# Patient Record
Sex: Male | Born: 1975 | Race: White | Hispanic: No | Marital: Married | Smoking: Never smoker
Health system: Southern US, Community
[De-identification: ages and names within clinical notes are randomized; demographics above are authoritative.]

## PROBLEM LIST (undated history)

## (undated) DIAGNOSIS — K589 Irritable bowel syndrome without diarrhea: Secondary | ICD-10-CM

## (undated) DIAGNOSIS — N200 Calculus of kidney: Secondary | ICD-10-CM

## (undated) HISTORY — DX: Calculus of kidney: N20.0

## (undated) HISTORY — PX: WISDOM TOOTH EXTRACTION: SHX21

## (undated) HISTORY — DX: Irritable bowel syndrome, unspecified: K58.9

---

## 2000-11-27 ENCOUNTER — Encounter: Payer: Self-pay | Admitting: Internal Medicine

## 2004-10-03 ENCOUNTER — Ambulatory Visit: Payer: Self-pay | Admitting: Urology

## 2008-03-25 DIAGNOSIS — K589 Irritable bowel syndrome without diarrhea: Secondary | ICD-10-CM

## 2008-05-26 ENCOUNTER — Ambulatory Visit: Payer: Self-pay | Admitting: Internal Medicine

## 2011-04-09 ENCOUNTER — Encounter: Payer: Self-pay | Admitting: Internal Medicine

## 2011-04-10 ENCOUNTER — Encounter: Payer: Self-pay | Admitting: Internal Medicine

## 2011-04-10 ENCOUNTER — Ambulatory Visit (INDEPENDENT_AMBULATORY_CARE_PROVIDER_SITE_OTHER): Payer: Self-pay | Admitting: Internal Medicine

## 2011-04-10 DIAGNOSIS — R519 Headache, unspecified: Secondary | ICD-10-CM | POA: Insufficient documentation

## 2011-04-10 DIAGNOSIS — R51 Headache: Secondary | ICD-10-CM

## 2011-04-10 NOTE — Assessment & Plan Note (Signed)
No neuro findings Clearly seems to be caffeine withdrawal Discussed gradual decrease in amount of caffeine he drinks but keep consistent day to day Discussed decreasing sugared sodas as well

## 2011-04-10 NOTE — Progress Notes (Signed)
  Subjective:    Patient ID: Blake Bruce, male    DOB: Oct 11, 1975, 35 y.o.   MRN: [id number]  HPI Does get occ right temproal headaches Now has been having every Sunday Has been constant over the past few days  All day 2 days ago, then yesterday afternoon Usually has started by lunch on Sunday and then continues---probably last 6-8 weeks Sunday is his day out of routine--doesn't have to get up early (will sleep in to 8AM) Has noted the headache start upon standing up quickly at times  Sharp pain, then can feel it "like with my pulse"--pounding No help with local pressure No nausea No photo or sonophobia No aura  Regular caffeine drinker---Dr Pepper. Doesn't have this on Sunday 24 ounces in AM, 16 ounces at lunch and sometimes some in afternoon Will switch to decaf for evening  No current outpatient prescriptions on file prior to visit.    Allergies  Allergen Reactions  . Sulfonamide Derivatives     REACTION: UNSPECIFIED    Past Medical History  Diagnosis Date  . IBS (irritable bowel syndrome)   . History of barium enema     normal 01/22/01    No past surgical history on file.  Family History  Problem Relation Age of Onset  . Cancer Mother     ovarian cancer  . Healthy Father   . Cancer Maternal Grandmother     breast cancer  . Cancer Maternal Grandfather     prostate cancer  . Heart disease Neg Hx   . Hypertension Neg Hx   . Diabetes Neg Hx     History   Social History  . Marital Status: Married    Spouse Name: N/A    Number of Children: N/A  . Years of Education: N/A   Occupational History  . landscaper    Social History Main Topics  . Smoking status: Never Smoker   . Smokeless tobacco: Never Used  . Alcohol Use: Yes  . Drug Use: No  . Sexually Active: Not on file   Other Topics Concern  . Not on file   Social History Narrative  . No narrative on file   Review of Systems No swallowing or speech problems No weakness    Objective:   Physical Exam  Constitutional: He appears well-developed and well-nourished. No distress.  Eyes: Conjunctivae and EOM are normal. Pupils are equal, round, and reactive to light.       Fundi benign  Neck: Normal range of motion. Neck supple.  Lymphadenopathy:    He has no cervical adenopathy.  Neurological: He is alert. He has normal strength. He displays no atrophy and no tremor. No cranial nerve deficit or sensory deficit. He exhibits normal muscle tone. He displays a negative Romberg sign. Coordination and gait normal.          Assessment & Plan:

## 2013-11-15 DIAGNOSIS — N200 Calculus of kidney: Secondary | ICD-10-CM

## 2013-11-15 HISTORY — DX: Calculus of kidney: N20.0

## 2013-11-29 ENCOUNTER — Ambulatory Visit (INDEPENDENT_AMBULATORY_CARE_PROVIDER_SITE_OTHER): Payer: 59 | Admitting: Family Medicine

## 2013-11-29 ENCOUNTER — Encounter: Payer: Self-pay | Admitting: Family Medicine

## 2013-11-29 DIAGNOSIS — N200 Calculus of kidney: Secondary | ICD-10-CM

## 2013-11-29 LAB — POCT URINALYSIS DIPSTICK
Bilirubin, UA: NEGATIVE
GLUCOSE UA: NEGATIVE
KETONES UA: NEGATIVE
Leukocytes, UA: NEGATIVE
Nitrite, UA: NEGATIVE
Protein, UA: 300
SPEC GRAV UA: 1.025
Urobilinogen, UA: 0.2
pH, UA: 6.5

## 2013-11-29 NOTE — Progress Notes (Signed)
The computer system has been down today. Please see her scanned note in the [facility] scanned section. 

## 2014-04-18 ENCOUNTER — Telehealth: Payer: Self-pay | Admitting: Internal Medicine

## 2014-04-18 NOTE — Telephone Encounter (Signed)
Left detailed message on VM with results, advised pt to call to confirm that he received this message

## 2014-04-18 NOTE — Telephone Encounter (Signed)
I would recommend he go to the ER for rabies prevention treatment. This is not available from our office. He should call ahead of time as they may have an alternative outpatient way for him to get the treatment

## 2014-04-18 NOTE — Telephone Encounter (Signed)
Patient Information:  Caller Name: Susann GivensFranklin  Phone: [phone]  Patient: Blake Bruce, Blake Bruce  Gender: Male  DOB: 08/09/1975  Age: 38 Years  PCP: Tillman AbideLetvak , [facility])  Office Follow Up:  Does the office need to follow up with this patient?: Yes  Instructions For The Office: Office please review with MD and make sure no further action is needed. Please call pt back with further instructions.  RN Note:  Office please review with MD and make sure no further action is needed. Please call pt back with further instructions.  Symptoms  Reason For Call & Symptoms: Pt is calling and states that he came in contact with a skunk on 04/14/14 and skunk was found to be positive for rabies;  skunk was caught in a fence and was dead; pt states that skunk was dead for approx 2 days prior to pt touching the skunk;   pt touched the skunk's 2 back legs and one front leg; this is the only contact pt had with the skunk; to the pt's knowledge he did not have any open wounds on his hands;  no symptoms at present time; animal control instructed for pt to f/u with the office  Reviewed Health History In EMR: Yes  Reviewed Medications In EMR: Yes  Reviewed Allergies In EMR: Yes  Reviewed Surgeries / Procedures: Yes  Date of Onset of Symptoms: 04/14/2014  Guideline(s) Used:  No Protocol Available - Information Only  Disposition Per Guideline:   Discuss with PCP and Callback by Nurse Today  Reason For Disposition Reached:   Nursing judgment  Advice Given:  Call Back If:  New symptoms develop  Patient Will Follow Care Advice:  YES

## 2014-04-19 NOTE — Telephone Encounter (Signed)
Spoke with patient and advised results, pt will call the ED

## 2014-04-20 ENCOUNTER — Emergency Department: Payer: Self-pay | Admitting: Emergency Medicine

## 2014-04-23 ENCOUNTER — Emergency Department: Payer: Self-pay | Admitting: Emergency Medicine

## 2014-04-27 ENCOUNTER — Emergency Department: Payer: Self-pay | Admitting: Emergency Medicine

## 2014-05-04 ENCOUNTER — Emergency Department: Payer: Self-pay | Admitting: Student

## 2014-05-17 HISTORY — PX: TONSILLECTOMY: SUR1361

## 2014-05-18 ENCOUNTER — Ambulatory Visit: Payer: Self-pay | Admitting: Otolaryngology

## 2014-09-02 ENCOUNTER — Encounter: Payer: Self-pay | Admitting: Internal Medicine

## 2014-09-02 ENCOUNTER — Ambulatory Visit (INDEPENDENT_AMBULATORY_CARE_PROVIDER_SITE_OTHER): Payer: 59 | Admitting: Internal Medicine

## 2014-09-02 VITALS — BP 110/80 | HR 65 | Temp 98.4°F | Ht 71.0 in | Wt 186.0 lb

## 2014-09-02 DIAGNOSIS — R079 Chest pain, unspecified: Secondary | ICD-10-CM

## 2014-09-02 DIAGNOSIS — Z0000 Encounter for general adult medical examination without abnormal findings: Secondary | ICD-10-CM | POA: Insufficient documentation

## 2014-09-02 LAB — COMPREHENSIVE METABOLIC PANEL
ALK PHOS: 53 U/L (ref 39–117)
ALT: 27 U/L (ref 0–53)
AST: 24 U/L (ref 0–37)
Albumin: 4.2 g/dL (ref 3.5–5.2)
BILIRUBIN TOTAL: 0.6 mg/dL (ref 0.2–1.2)
BUN: 20 mg/dL (ref 6–23)
CO2: 30 mEq/L (ref 19–32)
Calcium: 9.1 mg/dL (ref 8.4–10.5)
Chloride: 105 mEq/L (ref 96–112)
Creatinine, Ser: 1.12 mg/dL (ref 0.40–1.50)
GFR: 78.43 mL/min (ref >60.00)
GLUCOSE: 92 mg/dL (ref 70–99)
Potassium: 4.2 mEq/L (ref 3.5–5.1)
Sodium: 138 mEq/L (ref 135–145)
Total Protein: 7 g/dL (ref 6.0–8.3)

## 2014-09-02 LAB — LIPID PANEL
CHOLESTEROL: 168 mg/dL (ref 0–200)
HDL: 34.4 mg/dL — AB (ref >39.00)
LDL CALC: 117 mg/dL — AB (ref 0–99)
NonHDL: 133.6
Total CHOL/HDL Ratio: 5
Triglycerides: 82 mg/dL (ref 0.0–149.0)
VLDL: 16.4 mg/dL (ref 0.0–40.0)

## 2014-09-02 LAB — CBC WITH DIFFERENTIAL/PLATELET
BASOS ABS: 0 10*3/uL (ref 0.0–0.1)
BASOS PCT: 0.5 % (ref 0.0–3.0)
Eosinophils Absolute: 0.2 10*3/uL (ref 0.0–0.7)
Eosinophils Relative: 2.7 % (ref 0.0–5.0)
HEMATOCRIT: 42.9 % (ref 39.0–52.0)
HEMOGLOBIN: 14.5 g/dL (ref 13.0–17.0)
LYMPHS ABS: 1.3 10*3/uL (ref 0.7–4.0)
LYMPHS PCT: 20.6 % (ref 12.0–46.0)
MCHC: 33.9 g/dL (ref 30.0–36.0)
MCV: 84.6 fl (ref 78.0–100.0)
MONOS PCT: 7.7 % (ref 3.0–12.0)
Monocytes Absolute: 0.5 10*3/uL (ref 0.1–1.0)
NEUTROS ABS: 4.5 10*3/uL (ref 1.4–7.7)
Neutrophils Relative %: 68.5 % (ref 43.0–77.0)
Platelets: 225 10*3/uL (ref 150.0–400.0)
RBC: 5.07 Mil/uL (ref 4.22–5.81)
RDW: 13.8 % (ref 11.5–15.5)
WBC: 6.5 10*3/uL (ref 4.0–10.5)

## 2014-09-02 LAB — T4, FREE: Free T4: 0.67 ng/dL (ref 0.60–1.60)

## 2014-09-02 NOTE — Patient Instructions (Addendum)
Please get an arch support for your shoes and boots. Let me know if you continue to have chest pain with exercise---we will consider a stress test.  Plantar Fasciitis Plantar fasciitis is a common condition that causes foot pain. It is soreness (inflammation) of the band of tough fibrous tissue on the bottom of the foot that runs from the heel bone (calcaneus) to the ball of the foot. The cause of this soreness may be from excessive standing, poor fitting shoes, running on hard surfaces, being overweight, having an abnormal walk, or overuse (this is common in runners) of the painful foot or feet. It is also common in aerobic exercise dancers and ballet dancers. SYMPTOMS  Most people with plantar fasciitis complain of:  Severe pain in the morning on the bottom of their foot especially when taking the first steps out of bed. This pain recedes after a few minutes of walking.  Severe pain is experienced also during walking following a long period of inactivity.  Pain is worse when walking barefoot or up stairs DIAGNOSIS   Your caregiver will diagnose this condition by examining and feeling your foot.  Special tests such as X-rays of your foot, are usually not needed. PREVENTION   Consult a sports medicine professional before beginning a new exercise program.  Walking programs offer a good workout. With walking there is a lower chance of overuse injuries common to runners. There is less impact and less jarring of the joints.  Begin all new exercise programs slowly. If problems or pain develop, decrease the amount of time or distance until you are at a comfortable level.  Wear good shoes and replace them regularly.  Stretch your foot and the heel cords at the back of the ankle (Achilles tendon) both before and after exercise.  Run or exercise on even surfaces that are not hard. For example, asphalt is better than pavement.  Do not run barefoot on hard surfaces.  If using a treadmill, vary  the incline.  Do not continue to workout if you have foot or joint problems. Seek professional help if they do not improve. HOME CARE INSTRUCTIONS   Avoid activities that cause you pain until you recover.  Use ice or cold packs on the problem or painful areas after working out.  Only take over-the-counter or prescription medicines for pain, discomfort, or fever as directed by your caregiver.  Soft shoe inserts or athletic shoes with air or gel sole cushions may be helpful.  If problems continue or become more severe, consult a sports medicine caregiver or your own health care provider. Cortisone is a potent anti-inflammatory medication that may be injected into the painful area. You can discuss this treatment with your caregiver. MAKE SURE YOU:   Understand these instructions.  Will watch your condition.  Will get help right away if you are not doing well or get worse. Document Released: 02/26/2001 Document Revised: 08/26/2011 Document Reviewed: 04/27/2008 [facility] Patient Information 2015 [place]. This information is not intended to replace advice given to you by your health care provider. Make sure you discuss any questions you have with your health care provider.

## 2014-09-02 NOTE — Assessment & Plan Note (Signed)
Working on fitness and healthier eating UTD on tetanus

## 2014-09-02 NOTE — Progress Notes (Signed)
Pre visit review using our clinic review tool, if applicable. No additional management support is needed unless otherwise documented below in the visit note. 

## 2014-09-02 NOTE — Progress Notes (Signed)
Subjective:    Patient ID: Blake Bruce, male    DOB: 1976-01-11, 39 y.o.   MRN: [id number]  HPI Here for [physical and some concerns  Had tonsillectomy in December Recurrent infections  Having pain in right foot Hurts to walk at first  Sometimes hurts in day if he is barefooted  Has gained some weight--notices his stomach. Got gym membership Eating better now Noted on treadmill that he was getting sharp pains when running (4.5-5MPH) Does okay with the walking No excessive SOB No nausea or diaphoresis No problems when working  No current outpatient prescriptions on file prior to visit.   No current facility-administered medications on file prior to visit.    Allergies  Allergen Reactions  . Sulfonamide Derivatives     REACTION: UNSPECIFIED    Past Medical History  Diagnosis Date  . IBS (irritable bowel syndrome)   . History of barium enema     normal 01/22/01  . Kidney stone 6/15    Past Surgical History  Procedure Laterality Date  . Tonsillectomy  12/15    Dr Andee Poles    Family History  Problem Relation Age of Onset  . Cancer Mother     ovarian cancer  . Healthy Father   . Cancer Maternal Grandmother     breast cancer  . Cancer Maternal Grandfather     prostate cancer  . Heart disease Neg Hx   . Hypertension Neg Hx   . Diabetes Neg Hx     History   Social History  . Marital Status: Married    Spouse Name: N/A  . Number of Children: 1  . Years of Education: N/A   Occupational History  . landscaper    Social History Main Topics  . Smoking status: Never Smoker   . Smokeless tobacco: Never Used  . Alcohol Use: Yes  . Drug Use: No  . Sexual Activity: Not on file   Other Topics Concern  . Not on file   Social History Narrative   Review of Systems  Constitutional: Negative for fatigue and unexpected weight change.       Wears seat belt  HENT: Negative for dental problem, hearing loss and tinnitus.        Regular with dentist  Eyes:  Negative for visual disturbance.       No diplopia or unilateral vision loss  Respiratory: Negative for cough, chest tightness and shortness of breath.   Cardiovascular: Positive for chest pain. Negative for palpitations and leg swelling.  Gastrointestinal: Negative for nausea, vomiting, abdominal pain, constipation and blood in stool.  Endocrine: Negative for polydipsia and polyuria.  Genitourinary: Negative for urgency, frequency and difficulty urinating.       No sexual problems  Musculoskeletal: Negative for back pain, joint swelling and arthralgias.  Skin: Negative for rash.  Allergic/Immunologic: Positive for environmental allergies. Negative for immunocompromised state.       Mild--hasn't needed meds  Neurological: Negative for dizziness, syncope, weakness, light-headedness, numbness and headaches.  Hematological: Negative for adenopathy. Does not bruise/bleed easily.  Psychiatric/Behavioral: Negative for sleep disturbance and dysphoric mood. The patient is not nervous/anxious.        Objective:   Physical Exam  Constitutional: He is oriented to person, place, and time. He appears well-developed and well-nourished.  HENT:  Head: Normocephalic and atraumatic.  Right Ear: External ear normal.  Left Ear: External ear normal.  Mouth/Throat: Oropharynx is clear and moist. No oropharyngeal exudate.  Eyes: Conjunctivae and EOM are  normal. Pupils are equal, round, and reactive to light.  Neck: Normal range of motion. Neck supple. No thyromegaly present.  Cardiovascular: Normal rate, regular rhythm, normal heart sounds and intact distal pulses.  Exam reveals no gallop.   No murmur heard. Pulmonary/Chest: Effort normal and breath sounds normal. No respiratory distress. He has no wheezes. He has no rales. He exhibits no tenderness.  Abdominal: Soft. There is no tenderness.  Musculoskeletal: He exhibits no edema or tenderness.  Lymphadenopathy:    He has no cervical adenopathy.    Neurological: He is alert and oriented to person, place, and time.  Skin: No rash noted. No erythema.  Psychiatric: He has a normal mood and affect. His behavior is normal.          Assessment & Plan:

## 2014-09-02 NOTE — Assessment & Plan Note (Addendum)
With exercise but doesn't sound ischemic More likely mechanical though no tenderness EKG is benign  Reassured Will check labs He will try different aerobic regimen--like elliptical or raising treadmill incline instead of speed If ongoing exertional symptoms, can consider ETT

## 2014-09-05 ENCOUNTER — Encounter: Payer: Self-pay | Admitting: *Deleted

## 2014-10-10 LAB — SURGICAL PATHOLOGY

## 2015-03-31 ENCOUNTER — Encounter: Payer: Self-pay | Admitting: Family Medicine

## 2015-03-31 ENCOUNTER — Ambulatory Visit (INDEPENDENT_AMBULATORY_CARE_PROVIDER_SITE_OTHER): Payer: 59 | Admitting: Family Medicine

## 2015-03-31 VITALS — BP 110/62 | HR 75 | Temp 98.2°F | Wt 192.2 lb

## 2015-03-31 DIAGNOSIS — J069 Acute upper respiratory infection, unspecified: Secondary | ICD-10-CM | POA: Insufficient documentation

## 2015-03-31 MED ORDER — BENZONATATE 200 MG PO CAPS: 200.0000 mg | ORAL_CAPSULE | Freq: Three times a day (TID) | ORAL | Status: DC | PRN

## 2015-03-31 MED ORDER — AZITHROMYCIN 250 MG PO TABS: ORAL_TABLET | ORAL | Status: DC

## 2015-03-31 NOTE — Assessment & Plan Note (Addendum)
Likely viral, d/w pt.  Nontoxic.  See AVS.  Supportive care, prn tessalon.  If prolonged sx with discolored sputum would start zmax. He agrees.  Update us as needed. Nasal saline prior to bed may help with post nasal gtt and sputum production.

## 2015-03-31 NOTE — Progress Notes (Signed)
Pre visit review using our clinic review tool, if applicable. No additional management support is needed unless otherwise documented below in the visit note.  Sx started about 1 week ago.  ST, worse in the meantime.  Progressive cough.  Taking nyquil at night.  Waking up coughing.  Sputum, discolored, mainly at night, likely from post nasal gtt. Gagging from sputum with episodes at night.  Hoarse.  He has tried to limit talking today, with some help for his voice.  Started gargling with salt water today, with some relief.    Meds, vitals, and allergies reviewed.   ROS: See HPI.  Otherwise, noncontributory.  GEN: nad, alert and oriented HEENT: mucous membranes moist, tm w/o erythema, nasal exam w/o erythema, clear discharge noted,  OP with cobblestoning, sinuses not ttp x4 NECK: supple w/o LA CV: rrr.   PULM: ctab, no inc wob EXT: no edema SKIN: no acute rash

## 2015-03-31 NOTE — Patient Instructions (Signed)
Tessalon for cough.  Rest and fluids.  Rest your voice.  If still with discolored sputum next week, then start the antibiotics.   Take care.  Glad to see you.

## 2015-09-20 ENCOUNTER — Encounter: Payer: Self-pay | Admitting: Emergency Medicine

## 2015-09-20 ENCOUNTER — Emergency Department
Admission: EM | Admit: 2015-09-20 | Discharge: 2015-09-20 | Disposition: A | Discharge status: Home / Self Care | Payer: BLUE CROSS/BLUE SHIELD | Attending: Emergency Medicine | Admitting: Emergency Medicine

## 2015-09-20 ENCOUNTER — Emergency Department: Payer: BLUE CROSS/BLUE SHIELD

## 2015-09-20 DIAGNOSIS — Y999 Unspecified external cause status: Secondary | ICD-10-CM | POA: Diagnosis not present

## 2015-09-20 DIAGNOSIS — S0101XA Laceration without foreign body of scalp, initial encounter: Secondary | ICD-10-CM | POA: Diagnosis not present

## 2015-09-20 DIAGNOSIS — Y939 Activity, unspecified: Secondary | ICD-10-CM | POA: Insufficient documentation

## 2015-09-20 DIAGNOSIS — M7918 Myalgia, other site: Secondary | ICD-10-CM

## 2015-09-20 DIAGNOSIS — M542 Cervicalgia: Secondary | ICD-10-CM | POA: Insufficient documentation

## 2015-09-20 DIAGNOSIS — S0990XA Unspecified injury of head, initial encounter: Secondary | ICD-10-CM | POA: Diagnosis present

## 2015-09-20 DIAGNOSIS — Y929 Unspecified place or not applicable: Secondary | ICD-10-CM | POA: Diagnosis not present

## 2015-09-20 MED ORDER — TRAMADOL HCL 50 MG PO TABS: 50.0000 mg | ORAL_TABLET | Freq: Four times a day (QID) | ORAL | Status: AC | PRN

## 2015-09-20 MED ORDER — LIDOCAINE-EPINEPHRINE (PF) 1 %-1:200000 IJ SOLN
INTRAMUSCULAR | Status: AC
  Administered 2015-09-20: 21:00:00
  Filled 2015-09-20: qty 30
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
## 2015-09-20 NOTE — ED Notes (Signed)
Dried blood clensed from scalp; approx 1/2 irregular lac noted with no active bleeding
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
## 2015-09-25 ENCOUNTER — Telehealth: Payer: Self-pay | Admitting: Internal Medicine

## 2015-09-25 NOTE — Telephone Encounter (Signed)
Please check on him tomorrow morning 
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
## 2015-09-26 NOTE — Telephone Encounter (Signed)
Left message to call back  

## 2015-09-27 ENCOUNTER — Emergency Department: Payer: No Typology Code available for payment source

## 2015-09-27 ENCOUNTER — Emergency Department
Admission: EM | Admit: 2015-09-27 | Discharge: 2015-09-27 | Disposition: A | Discharge status: Home / Self Care | Payer: No Typology Code available for payment source | Attending: Emergency Medicine | Admitting: Emergency Medicine

## 2015-09-27 ENCOUNTER — Encounter: Payer: Self-pay | Admitting: Emergency Medicine

## 2015-09-27 DIAGNOSIS — S20212A Contusion of left front wall of thorax, initial encounter: Secondary | ICD-10-CM | POA: Insufficient documentation

## 2015-09-27 DIAGNOSIS — Y999 Unspecified external cause status: Secondary | ICD-10-CM | POA: Diagnosis not present

## 2015-09-27 DIAGNOSIS — Z792 Long term (current) use of antibiotics: Secondary | ICD-10-CM | POA: Diagnosis not present

## 2015-09-27 DIAGNOSIS — Z4802 Encounter for removal of sutures: Secondary | ICD-10-CM

## 2015-09-27 DIAGNOSIS — Y9389 Activity, other specified: Secondary | ICD-10-CM | POA: Diagnosis not present

## 2015-09-27 DIAGNOSIS — Y92410 Unspecified street and highway as the place of occurrence of the external cause: Secondary | ICD-10-CM | POA: Insufficient documentation
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
## 2015-09-27 NOTE — ED Notes (Signed)
NAD noted at time of D/C. Pt denies questions or concerns. Pt ambulatory to the lobby at this time.  

## 2015-10-03 NOTE — Telephone Encounter (Signed)
Called and left another message. It has been a week since he first called

## 2017-04-26 ENCOUNTER — Emergency Department: Payer: Self-pay

## 2017-04-26 ENCOUNTER — Ambulatory Visit (INDEPENDENT_AMBULATORY_CARE_PROVIDER_SITE_OTHER): Payer: Self-pay

## 2017-04-26 ENCOUNTER — Ambulatory Visit
Admission: EM | Admit: 2017-04-26 | Discharge: 2017-04-26 | Disposition: A | Discharge status: Other Acute Inpatient Hospital | Payer: BLUE CROSS/BLUE SHIELD | Attending: Family Medicine | Admitting: Family Medicine

## 2017-04-26 ENCOUNTER — Encounter: Payer: Self-pay | Admitting: Emergency Medicine

## 2017-04-26 ENCOUNTER — Other Ambulatory Visit: Payer: Self-pay

## 2017-04-26 ENCOUNTER — Emergency Department
Admission: EM | Admit: 2017-04-26 | Discharge: 2017-04-26 | Disposition: A | Discharge status: Home / Self Care | Payer: Self-pay | Attending: Emergency Medicine | Admitting: Emergency Medicine

## 2017-04-26 DIAGNOSIS — Y929 Unspecified place or not applicable: Secondary | ICD-10-CM | POA: Insufficient documentation

## 2017-04-26 DIAGNOSIS — Y9375 Activity, martial arts: Secondary | ICD-10-CM | POA: Insufficient documentation

## 2017-04-26 DIAGNOSIS — W501XXA Accidental kick by another person, initial encounter: Secondary | ICD-10-CM | POA: Insufficient documentation

## 2017-04-26 DIAGNOSIS — Y999 Unspecified external cause status: Secondary | ICD-10-CM | POA: Insufficient documentation

## 2017-04-26 DIAGNOSIS — S52231A Displaced oblique fracture of shaft of right ulna, initial encounter for closed fracture: Secondary | ICD-10-CM

## 2017-04-26 DIAGNOSIS — S59911A Unspecified injury of right forearm, initial encounter: Secondary | ICD-10-CM

## 2017-04-26 DIAGNOSIS — S52201A Unspecified fracture of shaft of right ulna, initial encounter for closed fracture: Secondary | ICD-10-CM | POA: Insufficient documentation

## 2017-04-26 MED ORDER — IBUPROFEN 800 MG PO TABS: 800.0000 mg | ORAL_TABLET | Freq: Three times a day (TID) | ORAL | 0 refills | Status: DC | PRN

## 2017-04-26 MED ORDER — OXYCODONE-ACETAMINOPHEN 7.5-325 MG PO TABS: 1.0000 | ORAL_TABLET | Freq: Four times a day (QID) | ORAL | 0 refills | Status: DC | PRN

## 2017-04-26 MED ORDER — OXYCODONE-ACETAMINOPHEN 5-325 MG PO TABS
1.0000 | ORAL_TABLET | Freq: Once | ORAL | Status: AC
  Administered 2017-04-26: 1 via ORAL
  Filled 2017-04-26: qty 1

## 2017-04-26 NOTE — ED Notes (Signed)
Pt states that he is having numbness and tingling in his fingers from the knuckles down.  Pt states that he was seen a [facility] today and splinted, but did not receive any pain medication while there.  Pt states that he was sent here for further eval by an orthopedic.  Pt A&Ox4, ambulatory to room.
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
## 2017-04-26 NOTE — Discharge Instructions (Signed)
To the ER.  Take care  Dr. Adriana Simasook

## 2017-04-26 NOTE — ED Provider Notes (Addendum)
[facility]    CSN: [id number] Arrival date & time: 04/26/17  1517  History   Chief Complaint Chief Complaint  Patient presents with  . Arm Injury    right   HPI  41 year old male presents with a forearm injury.  The patient was at taekwondo tournament today.  He was sparring and blocked a kick with his right forearm.  He subsequently developed severe pain.  Patient reports that he is having some numbness and tingling of his hand.  His pain is severe.  No reports of wrist pain or elbow pain.  He localizes the pain to the lateral forearm.  Associated swelling.  No medications tried.  He was put in a brace and sent over here for evaluation.  He has no other complaints or concerns at this time.  Past Medical History:  Diagnosis Date  . IBS (irritable bowel syndrome)   . Kidney stone 6/15   Past Surgical History:  Procedure Laterality Date  . TONSILLECTOMY  12/15   Dr Andee PolesVaught  . WISDOM TOOTH EXTRACTION      Home Medications    Prior to Admission medications   Not on File   Family History Family History  Problem Relation Age of Onset  . Cancer Mother        ovarian cancer  . Healthy Father   . Cancer Maternal Grandmother        breast cancer  . Cancer Maternal Grandfather        prostate cancer  . Heart disease Neg Hx   . Hypertension Neg Hx   . Diabetes Neg Hx    Social History Social History   Tobacco Use  . Smoking status: Never Smoker  . Smokeless tobacco: Never Used  Substance Use Topics  . Alcohol use: Yes  . Drug use: No   Allergies   Sulfonamide derivatives   Review of Systems Review of Systems  Musculoskeletal:       Forearm pain/injury.  No wrist pain.  No elbow pain.  He reports associated swelling.  Neurological: Positive for numbness.  All other systems reviewed and are negative.  Physical Exam Triage Vital Signs ED Triage Vitals [04/26/17 1525]  Enc Vitals Group     BP 128/73     Pulse Rate 82     Resp 17     Temp 98.2  F (36.8 C)     Temp Source Oral     SpO2 96 %     Weight 182 lb (82.6 kg)     Height 6' (1.829 m)     Head Circumference      Peak Flow      Pain Score 8     Pain Loc      Pain Edu?      Excl. in GC?    Updated Vital Signs BP 128/73 (BP Location: Right Arm)   Pulse 82   Temp 98.2 F (36.8 C) (Oral)   Resp 17   Ht 6' (1.829 m)   Wt 182 lb (82.6 kg)   SpO2 96%   BMI 24.68 kg/m   Physical Exam  Constitutional: He is oriented to person, place, and time. He appears well-developed and well-nourished.  Appears in pain.  HENT:  Head: Normocephalic and atraumatic.  Nose: Nose normal.  Eyes: Conjunctivae are normal. No scleral icterus.  Neck: Normal range of motion. No tracheal deviation present.  Cardiovascular: Normal rate and regular rhythm.  No murmur heard. Pulmonary/Chest: Effort normal and breath  sounds normal. He has no wheezes. He has no rales.  Musculoskeletal:  Right wrist -nontender to palpation.  No swelling.  2+ radial pulse.  Good cap refill of the digits.  Right elbow -no areas of tenderness.  No edema.  Right forearm -edema noted laterally.  Patient with severe tenderness laterally.  Neurological: He is alert and oriented to person, place, and time. He exhibits normal muscle tone.  Skin: Skin is warm. Capillary refill takes less than 2 seconds.  Psychiatric: He has a normal mood and affect. His behavior is normal.  Vitals reviewed.  UC Treatments / Results  Labs (all labs ordered are listed, but only abnormal results are displayed) Labs Reviewed - No data to display  EKG  EKG Interpretation None      Radiology Dg Forearm Right  Result Date: 04/26/2017 CLINICAL DATA:  Pain after trauma EXAM: RIGHT FOREARM - 2 VIEW COMPARISON:  None. FINDINGS: There is a mildly displaced fracture through the mid ulnar diaphysis. No other acute abnormalities. IMPRESSION: Mildly displaced fracture through the mid ulnar diaphysis. Electronically Signed   By: Gerome Samavid   Williams III M.D   On: 04/26/2017 15:59    Procedures Procedures (including critical care time)  Medications Ordered in UC Medications - No data to display   Initial Impression / Assessment and Plan / UC Course  I have reviewed the triage vital signs and the nursing notes.  Pertinent labs & imaging results that were available during my care of the patient were reviewed by me and considered in my medical decision making (see chart for details).     41 year old male presents with a forearm injury.  X-ray revealed a displaced fracture to the midshaft of the ulna with angulation.  Patient with good pulses but endorses numbness and tingling.  He is going to [facility] for evaluation by orthopedics.  Patient was placed in a long-arm posterior splint prior to leaving.  Final Clinical Impressions(s) / UC Diagnoses   Final diagnoses:  Closed displaced oblique fracture of shaft of right ulna, initial encounter    ED Discharge Orders    None     Controlled Substance Prescriptions [place] Controlled Substance Registry consulted? Not Applicable   Tommie SamsCook, Dudley Cooley G, DO 04/26/17 1621

## 2017-04-26 NOTE — ED Triage Notes (Signed)
Patient complains of right forearm pain. Patient states that he was in a taekwondo tournament and received a roundhouse kick to the forearm. Patient states that when kicked he lost all feeling in his finger and noticed numbness.

## 2017-04-26 NOTE — ED Triage Notes (Signed)
Sent here from [facility]. Patient is splinted. States sent here for referral to ortho and eval if will need surgery. [facility]

## 2017-04-29 ENCOUNTER — Telehealth: Payer: Self-pay

## 2017-04-29 NOTE — Telephone Encounter (Signed)
Left message to see how pt is doing after recent ER visit for arm fracture. Dr Alphonsus SiasLetvak wants to know if he has followed up with ortho.

## 2017-09-05 ENCOUNTER — Ambulatory Visit: Payer: Self-pay | Admitting: Family Medicine

## 2017-09-05 ENCOUNTER — Encounter: Payer: Self-pay | Admitting: Family Medicine

## 2017-09-05 VITALS — BP 122/72 | HR 82 | Temp 98.2°F | Wt 187.2 lb

## 2017-09-05 DIAGNOSIS — J069 Acute upper respiratory infection, unspecified: Secondary | ICD-10-CM

## 2017-09-05 MED ORDER — AMOXICILLIN 875 MG PO TABS: 875.0000 mg | ORAL_TABLET | Freq: Two times a day (BID) | ORAL | 0 refills | Status: DC

## 2017-09-05 NOTE — Patient Instructions (Signed)
For nasal congestion you can use Afrin nasal spray for 3 days max, Sudafed, saline nasal spray (generic is fine for all). Also add Mucinex (generic is fine).  Take your loratadine and flonase at night.  If not better in 3-4 days, start antibiotic.  For cough you can try Delsym. Drink enough fluids to make your urine light yellow. For fever/chill/muscle aches you can take over the counter acetaminophen or ibuprofen.  Please come back in if you are not better in 5-7 days or if you develop wheezing, shortness of breath or persistent vomiting.

## 2017-09-05 NOTE — Progress Notes (Signed)
Subjective:    Patient ID: Trellis MomentFranklin M Staff, male    DOB: 06/12/1976, 42 y.o.   MRN: [id number]  HPI This is a 42 yo male who presents today with 1 week of clear post nasal drainage, dry cough, has been taking allergy medication (loratadine and Flonase), Robitussin DM and now has yellow/green sputum and nasal drainage, increased sinus pressure, eyes draining at night. Subjective fever. No wheeze or SOB, rattling with cough. Sleeping ok. Feels fine once he gets up and expectorates post nasal drainage.  Girlfriend sick with similar symptoms.  Never smoker, no history of asthma.   Past Medical History:  Diagnosis Date  . IBS (irritable bowel syndrome)   . Kidney stone 6/15   Past Surgical History:  Procedure Laterality Date  . TONSILLECTOMY  12/15   Dr Andee PolesVaught  . WISDOM TOOTH EXTRACTION     Family History  Problem Relation Age of Onset  . Cancer Mother        ovarian cancer  . Healthy Father   . Cancer Maternal Grandmother        breast cancer  . Cancer Maternal Grandfather        prostate cancer  . Heart disease Neg Hx   . Hypertension Neg Hx   . Diabetes Neg Hx    Social History   Tobacco Use  . Smoking status: Never Smoker  . Smokeless tobacco: Never Used  Substance Use Topics  . Alcohol use: Yes  . Drug use: No      Review of Systems Per HPI    Objective:   Physical Exam  Constitutional: He is oriented to person, place, and time. He appears well-developed and well-nourished.  HENT:  Head: Normocephalic and atraumatic.  Right Ear: Tympanic membrane, external ear and ear canal normal.  Left Ear: Tympanic membrane, external ear and ear canal normal.  Nose: Mucosal edema and rhinorrhea present. Right sinus exhibits no maxillary sinus tenderness and no frontal sinus tenderness. Left sinus exhibits no maxillary sinus tenderness and no frontal sinus tenderness.  Mouth/Throat: Uvula is midline and mucous membranes are normal. Posterior oropharyngeal erythema (thick,  white post nasal drainage. ) present. No oropharyngeal exudate or posterior oropharyngeal edema.  Sound hoarse.   Eyes: EOM are normal. Right eye exhibits no discharge. Left eye exhibits no discharge. Right conjunctiva is injected (mildly). Left conjunctiva is injected (mildly).  Neck: Normal range of motion. Neck supple.  Cardiovascular: Normal rate, regular rhythm and normal heart sounds.  Pulmonary/Chest: Effort normal and breath sounds normal. No respiratory distress. He has no wheezes. He has no rales.  Lymphadenopathy:    He has no cervical adenopathy.  Neurological: He is alert and oriented to person, place, and time.  Skin: Skin is warm and dry.  Psychiatric: He has a normal mood and affect. His behavior is normal. Thought content normal.  Vitals reviewed.     BP 122/72   Pulse 82   Temp 98.2 F (36.8 C) (Oral)   Wt 187 lb 4 oz (84.9 kg)   SpO2 97%   BMI 25.40 kg/m  Wt Readings from Last 3 Encounters:  09/05/17 187 lb 4 oz (84.9 kg)  04/26/17 182 lb (82.6 kg)  04/26/17 182 lb (82.6 kg)       Assessment & Plan:  1. URI, acute - URI vs Allergic rhinitis -  Patient Instructions  For nasal congestion you can use Afrin nasal spray for 3 days max, Sudafed, saline nasal spray (generic is fine for all).  Also add Mucinex (generic is fine).  Take your loratadine and flonase at night.  If not better in 3-4 days, start antibiotic.  For cough you can try Delsym. Drink enough fluids to make your urine light yellow. For fever/chill/muscle aches you can take over the counter acetaminophen or ibuprofen.  Please come back in if you are not better in 5-7 days or if you develop wheezing, shortness of breath or persistent vomiting.     - amoxicillin (AMOXIL) 875 MG tablet; Take 1 tablet (875 mg total) by mouth 2 (two) times daily.  Dispense: 14 tablet; Refill: 0   Olean Ree, FNP-BC  [facility], [facility]  09/05/2017 1:59 PM

## 2017-09-26 ENCOUNTER — Other Ambulatory Visit: Payer: Self-pay

## 2017-09-26 ENCOUNTER — Encounter: Payer: Self-pay | Admitting: Family Medicine

## 2017-09-26 ENCOUNTER — Ambulatory Visit: Payer: Self-pay | Admitting: Family Medicine

## 2017-09-26 VITALS — BP 90/64 | HR 89 | Temp 98.3°F | Ht 71.5 in | Wt 181.8 lb

## 2017-09-26 DIAGNOSIS — J029 Acute pharyngitis, unspecified: Secondary | ICD-10-CM

## 2017-09-26 DIAGNOSIS — J020 Streptococcal pharyngitis: Secondary | ICD-10-CM

## 2017-09-26 LAB — POCT RAPID STREP A (OFFICE): Rapid Strep A Screen: POSITIVE — AB

## 2017-09-26 MED ORDER — AMOXICILLIN-POT CLAVULANATE 875-125 MG PO TABS: 1.0000 | ORAL_TABLET | Freq: Two times a day (BID) | ORAL | 0 refills | Status: DC

## 2017-09-26 NOTE — Progress Notes (Signed)
Subjective:    Patient ID: Blake Bruce, male    DOB: 11/04/1975, 42 y.o.   MRN: [id number]  Fever   This is a new problem. The current episode started yesterday. The problem occurs intermittently. The maximum temperature noted was 102 to 102.9 F. Associated symptoms include coughing and muscle aches. Pertinent negatives include no congestion, diarrhea, ear pain or vomiting. He has tried NSAIDs for the symptoms. The treatment provided significant relief.  Sore Throat   The current episode started more than 1 month ago. The problem has been waxing and waning. Neither side of throat is experiencing more pain than the other. The maximum temperature recorded prior to his arrival was 102 - 102.9 F. Associated symptoms include coughing and a plugged ear sensation. Pertinent negatives include no congestion, diarrhea, ear discharge, ear pain, shortness of breath, trouble swallowing or vomiting. Associated symptoms comments: oly mild cough, has post nasal drip. He has had no exposure to strep or mono.    Tried afrin, sudafed, mucinex, loratidine and flonase.  Seen on 09/05/2017  Dx with viral URI vs allergic rhinitis:  But if was not better in 3-4 days was given Amox to treat possible bacterial infection.  Blood pressure 90/64, pulse 89, temperature 98.3 F (36.8 C), temperature source Oral, height 5' 11.5" (1.816 m), weight 181 lb 12 oz (82.4 kg).   HX: Nonsmoker, COPD  Review of Systems  Constitutional: Positive for fever.  HENT: Negative for congestion, ear discharge, ear pain and trouble swallowing.   Respiratory: Positive for cough. Negative for shortness of breath.   Gastrointestinal: Negative for diarrhea and vomiting.       Objective:   Physical Exam  Constitutional: Vital signs are normal. He appears well-developed and well-nourished.  Non-toxic appearance. He does not appear ill. No distress.  HENT:  Head: Normocephalic and atraumatic.  Right Ear: Hearing, tympanic membrane,  external ear and ear canal normal. No tenderness. No foreign bodies. Tympanic membrane is not retracted and not bulging.  Left Ear: Hearing, tympanic membrane, external ear and ear canal normal. No tenderness. No foreign bodies. Tympanic membrane is not retracted and not bulging.  Nose: Nose normal. No mucosal edema or rhinorrhea. Right sinus exhibits no maxillary sinus tenderness and no frontal sinus tenderness. Left sinus exhibits no maxillary sinus tenderness and no frontal sinus tenderness.  Mouth/Throat: Uvula is midline and mucous membranes are normal. Normal dentition. No dental caries. Posterior oropharyngeal erythema present. No oropharyngeal exudate or tonsillar abscesses.  Eyes: Pupils are equal, round, and reactive to light. Conjunctivae, EOM and lids are normal. Lids are everted and swept, no foreign bodies found.  Neck: Trachea normal, normal range of motion and phonation normal. Neck supple. Carotid bruit is not present. No thyroid mass and no thyromegaly present.  Cardiovascular: Normal rate, regular rhythm, S1 normal, S2 normal, normal heart sounds, intact distal pulses and normal pulses. Exam reveals no gallop.  No murmur heard. Pulmonary/Chest: Effort normal and breath sounds normal. No respiratory distress. He has no wheezes. He has no rhonchi. He has no rales.  Abdominal: Soft. Normal appearance and bowel sounds are normal. There is no hepatosplenomegaly. There is no tenderness. There is no rebound, no guarding and no CVA tenderness. No hernia.  Neurological: He is alert. He has normal reflexes.  Skin: Skin is warm, dry and intact. No rash noted.  Psychiatric: He has a normal mood and affect. His speech is normal and behavior is normal. Judgment normal.  Assessment & Plan:

## 2017-09-26 NOTE — Patient Instructions (Addendum)
Treat with a course of Augmentin given Amox in last 30 days.  Tylenol or ibuprofen for pain and fever.  Push fluids and rest.

## 2017-09-26 NOTE — Assessment & Plan Note (Signed)
Treat with a course of Augmentin given amox in last 30 days.  tylenol or ibuprofen for pain and fever.  Push fluids and rest.

## 2017-11-03 ENCOUNTER — Encounter: Payer: Self-pay | Admitting: Internal Medicine

## 2018-06-24 ENCOUNTER — Encounter: Payer: Self-pay | Admitting: Family Medicine

## 2018-06-24 ENCOUNTER — Ambulatory Visit (INDEPENDENT_AMBULATORY_CARE_PROVIDER_SITE_OTHER): Payer: Self-pay | Admitting: Family Medicine

## 2018-06-24 VITALS — BP 116/70 | HR 68 | Temp 98.3°F | Ht 71.5 in | Wt 189.0 lb

## 2018-06-24 DIAGNOSIS — J3089 Other allergic rhinitis: Secondary | ICD-10-CM

## 2018-06-24 MED ORDER — FLUTICASONE PROPIONATE 50 MCG/ACT NA SUSP: 2.0000 | Freq: Every day | NASAL | 6 refills | Status: AC

## 2018-06-24 NOTE — Patient Instructions (Signed)
Good to see you today  Please use an Afrin type nasal spray twice a day for 3 days only. At bedtime, do the fluticasone spray after the Afrin and continue for 2-3 weeks.   Change to a different antihistamine- your body can get accustomed to the same one  If not better in a week, please call the office

## 2018-06-24 NOTE — Progress Notes (Signed)
Subjective:    Patient ID: Blake Bruce, male    DOB: [birth year], 43 y.o.   MRN: [id number]  HPI This is a 43 yo male who presents today with a month of head congestion. Has taken sudafed, antihistamine. Increased pressure in face, nasal drainage started clear, now with color. Symptoms come and go. He has been taking same antihistamine for at least a year. He works outside International aid/development worker.  No cough, no SOB, no wheeze.   Past Medical History:  Diagnosis Date  . IBS (irritable bowel syndrome)   . Kidney stone 6/15   Past Surgical History:  Procedure Laterality Date  . TONSILLECTOMY  12/15   Dr Andee Poles  . WISDOM TOOTH EXTRACTION     Family History  Problem Relation Age of Onset  . Cancer Mother        ovarian cancer  . Healthy Father   . Cancer Maternal Grandmother        breast cancer  . Cancer Maternal Grandfather        prostate cancer  . Heart disease Neg Hx   . Hypertension Neg Hx   . Diabetes Neg Hx    Social History   Tobacco Use  . Smoking status: Never Smoker  . Smokeless tobacco: Never Used  Substance Use Topics  . Alcohol use: Yes  . Drug use: No      Review of Systems Per HPI    Objective:   Physical Exam Vitals signs reviewed.  Constitutional:      Appearance: Normal appearance. He is normal weight.  HENT:     Head: Normocephalic and atraumatic.     Right Ear: Tympanic membrane, ear canal and external ear normal.     Left Ear: Tympanic membrane, ear canal and external ear normal.     Nose: Congestion present.     Mouth/Throat:     Mouth: Mucous membranes are moist.     Pharynx: Oropharynx is clear.  Eyes:     Conjunctiva/sclera: Conjunctivae normal.  Neck:     Musculoskeletal: Normal range of motion and neck supple.  Cardiovascular:     Rate and Rhythm: Normal rate and regular rhythm.     Heart sounds: Normal heart sounds.  Pulmonary:     Effort: Pulmonary effort is normal.     Breath sounds: Normal breath sounds.    Lymphadenopathy:     Cervical: No cervical adenopathy.  Skin:    General: Skin is warm and dry.  Neurological:     Mental Status: He is alert and oriented to person, place, and time.  Psychiatric:        Mood and Affect: Mood normal.        Behavior: Behavior normal.        Thought Content: Thought content normal.        Judgment: Judgment normal.       BP 116/70   Pulse 68   Temp 98.3 F (36.8 C) (Oral)   Ht 5' 11.5" (1.816 m)   Wt 189 lb (85.7 kg)   SpO2 98%   BMI 25.99 kg/m  Wt Readings from Last 3 Encounters:  06/24/18 189 lb (85.7 kg)  09/26/17 181 lb 12 oz (82.4 kg)  09/05/17 187 lb 4 oz (84.9 kg)       Assessment & Plan:  1. Non-seasonal allergic rhinitis, unspecified trigger - suspect allergies vs infection with intermittent nature - Provided written and verbal information regarding diagnosis and treatment. -  Patient Instructions  Good to see you today  Please use an Afrin type nasal spray twice a day for 3 days only. At bedtime, do the fluticasone spray after the Afrin and continue for 2-3 weeks.   Change to a different antihistamine- your body can get accustomed to the same one  If not better in a week, please call the office  - fluticasone (FLONASE) 50 MCG/ACT nasal spray; Place 2 sprays into both nostrils daily.  Dispense: 16 g; Refill: 6   Olean Ree, FNP-BC  [facility], [facility]  06/26/2018 9:16 PM

## 2018-06-26 ENCOUNTER — Encounter: Payer: Self-pay | Admitting: Family Medicine

## 2019-07-01 ENCOUNTER — Ambulatory Visit: Payer: Self-pay | Attending: Internal Medicine

## 2019-07-01 DIAGNOSIS — Z20822 Contact with and (suspected) exposure to covid-19: Secondary | ICD-10-CM | POA: Insufficient documentation

## 2019-07-02 LAB — NOVEL CORONAVIRUS, NAA: SARS-CoV-2, NAA: NOT DETECTED

## 2020-02-08 ENCOUNTER — Other Ambulatory Visit: Payer: Self-pay | Admitting: Physician Assistant

## 2020-02-08 DIAGNOSIS — K1120 Sialoadenitis, unspecified: Secondary | ICD-10-CM

## 2020-02-18 ENCOUNTER — Ambulatory Visit
Admission: RE | Admit: 2020-02-18 | Discharge: 2020-02-18 | Disposition: A | Discharge status: Home / Self Care | Payer: Self-pay | Source: Ambulatory Visit | Attending: Physician Assistant | Admitting: Physician Assistant

## 2020-02-18 ENCOUNTER — Other Ambulatory Visit: Payer: Self-pay

## 2020-02-18 DIAGNOSIS — K1120 Sialoadenitis, unspecified: Secondary | ICD-10-CM | POA: Insufficient documentation

## 2020-08-04 ENCOUNTER — Other Ambulatory Visit (HOSPITAL_COMMUNITY): Payer: Self-pay | Admitting: Urology

## 2020-08-04 ENCOUNTER — Other Ambulatory Visit: Payer: Self-pay | Admitting: Urology

## 2020-08-04 DIAGNOSIS — R310 Gross hematuria: Secondary | ICD-10-CM

## 2020-08-10 ENCOUNTER — Ambulatory Visit (HOSPITAL_COMMUNITY)
Admission: RE | Admit: 2020-08-10 | Discharge: 2020-08-10 | Disposition: A | Discharge status: Home / Self Care | Payer: Self-pay | Source: Ambulatory Visit | Attending: Urology | Admitting: Urology

## 2020-08-10 DIAGNOSIS — R310 Gross hematuria: Secondary | ICD-10-CM | POA: Insufficient documentation

## 2020-08-10 MED ORDER — IOHEXOL 300 MG/ML  SOLN
100.0000 mL | Freq: Once | INTRAMUSCULAR | Status: AC | PRN
  Administered 2020-08-10: 100 mL via INTRAVENOUS

## 2020-12-24 DIAGNOSIS — S80869A Insect bite (nonvenomous), unspecified lower leg, initial encounter: Secondary | ICD-10-CM | POA: Diagnosis not present

## 2020-12-24 DIAGNOSIS — W57XXXA Bitten or stung by nonvenomous insect and other nonvenomous arthropods, initial encounter: Secondary | ICD-10-CM | POA: Diagnosis not present

## 2021-07-24 DIAGNOSIS — L814 Other melanin hyperpigmentation: Secondary | ICD-10-CM | POA: Diagnosis not present

## 2021-07-24 DIAGNOSIS — D2371 Other benign neoplasm of skin of right lower limb, including hip: Secondary | ICD-10-CM | POA: Diagnosis not present

## 2021-07-24 DIAGNOSIS — X32XXXA Exposure to sunlight, initial encounter: Secondary | ICD-10-CM | POA: Diagnosis not present

## 2021-12-13 DIAGNOSIS — J029 Acute pharyngitis, unspecified: Secondary | ICD-10-CM | POA: Diagnosis not present

## 2021-12-13 DIAGNOSIS — R070 Pain in throat: Secondary | ICD-10-CM | POA: Diagnosis not present

## 2021-12-13 DIAGNOSIS — Z7253 High risk bisexual behavior: Secondary | ICD-10-CM | POA: Diagnosis not present

## 2021-12-14 DIAGNOSIS — R070 Pain in throat: Secondary | ICD-10-CM | POA: Diagnosis not present

## 2021-12-14 DIAGNOSIS — J029 Acute pharyngitis, unspecified: Secondary | ICD-10-CM | POA: Diagnosis not present

## 2021-12-14 DIAGNOSIS — Z7253 High risk bisexual behavior: Secondary | ICD-10-CM | POA: Diagnosis not present

## 2022-01-03 DIAGNOSIS — Z0000 Encounter for general adult medical examination without abnormal findings: Secondary | ICD-10-CM | POA: Diagnosis not present

## 2022-01-03 DIAGNOSIS — Z13220 Encounter for screening for lipoid disorders: Secondary | ICD-10-CM | POA: Diagnosis not present

## 2022-01-03 DIAGNOSIS — Z23 Encounter for immunization: Secondary | ICD-10-CM | POA: Diagnosis not present

## 2022-01-21 DIAGNOSIS — U071 COVID-19: Secondary | ICD-10-CM | POA: Diagnosis not present

## 2022-01-21 DIAGNOSIS — R509 Fever, unspecified: Secondary | ICD-10-CM | POA: Diagnosis not present

## 2022-01-21 DIAGNOSIS — R52 Pain, unspecified: Secondary | ICD-10-CM | POA: Diagnosis not present

## 2022-07-08 IMAGING — CT CT ABD-PEL WO/W CM
3 of 8 series · 13 of 46 positions shown, 19 images · IV contrast (omnipaque)
Comparison: 10/03/2004

CLINICAL DATA: Gross hematuria

EXAM:
CT ABDOMEN AND PELVIS WITHOUT AND WITH CONTRAST
TECHNIQUE: Multidetector CT imaging of the abdomen and pelvis was performed
following the standard protocol before and following the bolus
administration of intravenous contrast.
CONTRAST:  100mL OMNIPAQUE IOHEXOL 300 MG/ML  SOLN

[Series 2: axial pre · axial · non-contrast · 0.83mm/px · z∈[+1119,+1229]mm · 3 of 98 slices shown]
[im 11/98  soft-tissue]
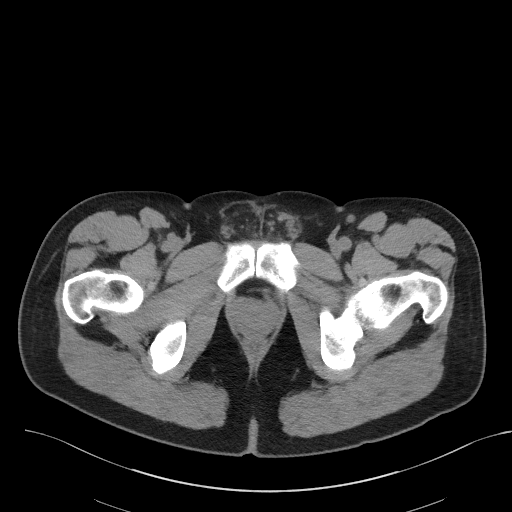
[im 22/98  soft-tissue]
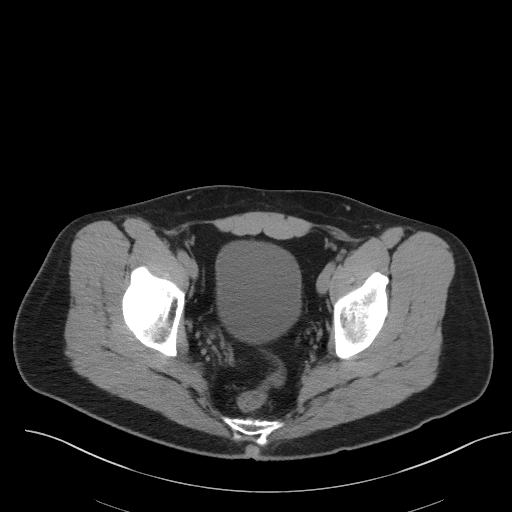
[im 33/98  soft-tissue]
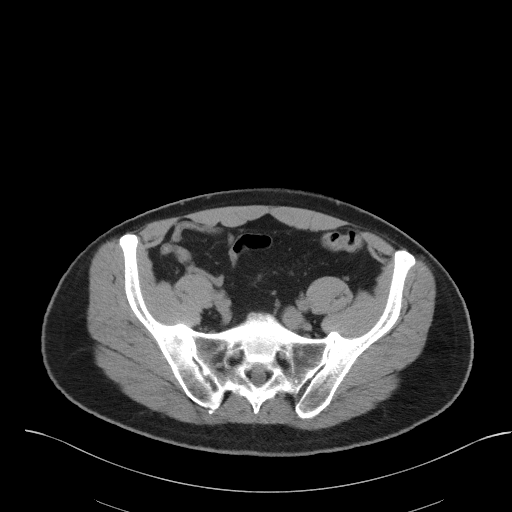

[Series 3: coronal pre · coronal · non-contrast · 1.03mm/px · 3 of 77 slices shown, 4 images]
[im 20/77  soft-tissue]
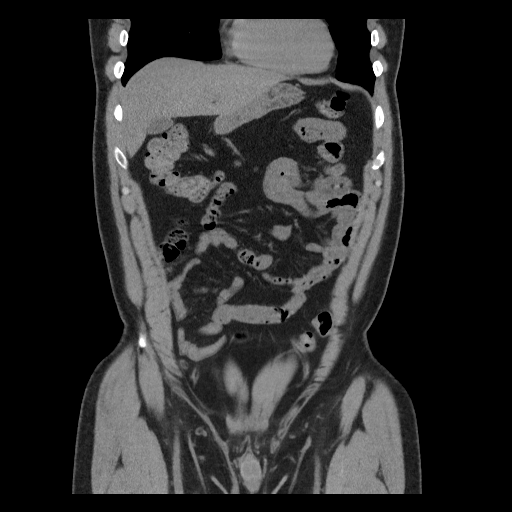
[im 39/77  soft-tissue]
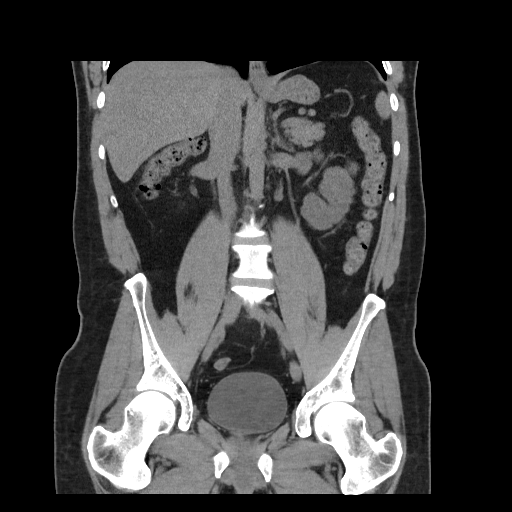
[im 39/77  bone]
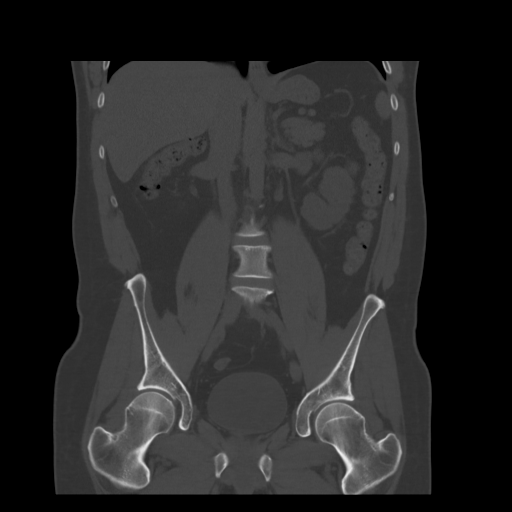
[im 58/77  soft-tissue]
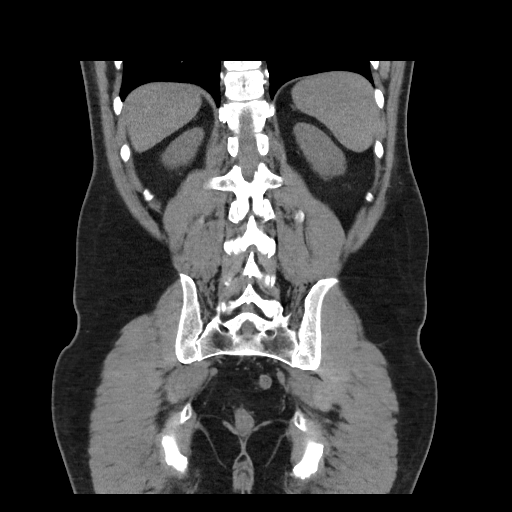

[Series 8: axial delay · axial · delayed · 0.81mm/px · z∈[+1129,+1509]mm · 7 of 102 slices shown, 12 images]
[im 13/102  soft-tissue]
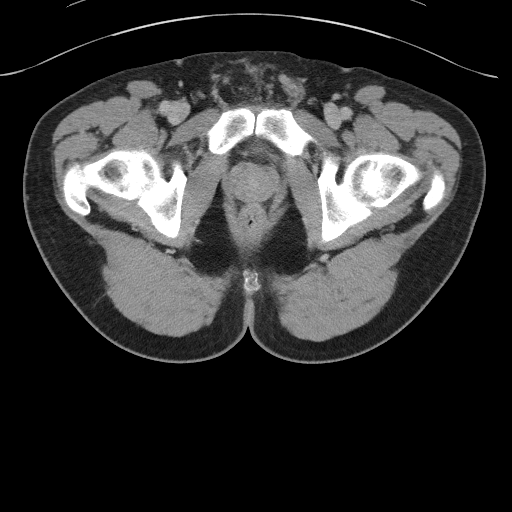
[im 13/102  bone]
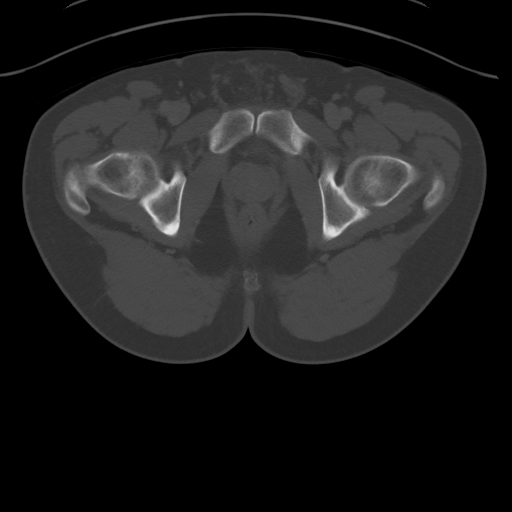
[im 26/102  soft-tissue]
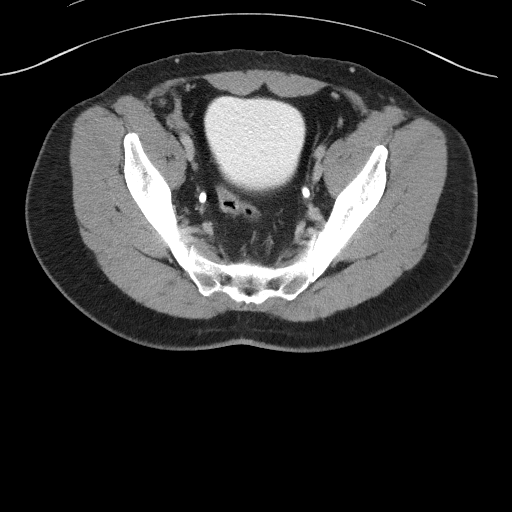
[im 38/102  soft-tissue]
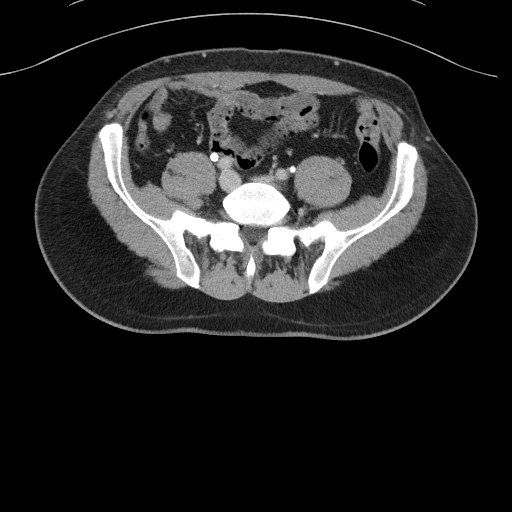
[im 51/102  soft-tissue]
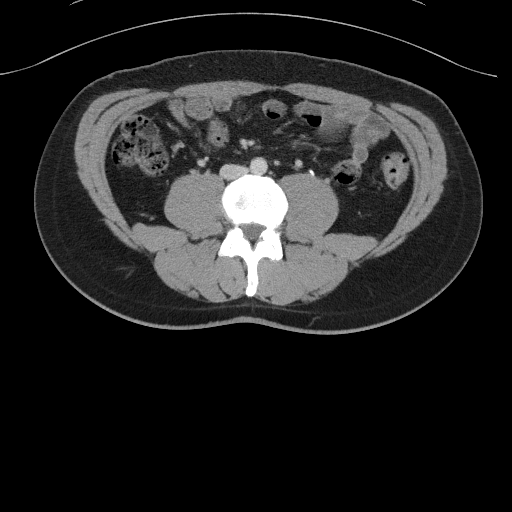
[im 51/102  lung]
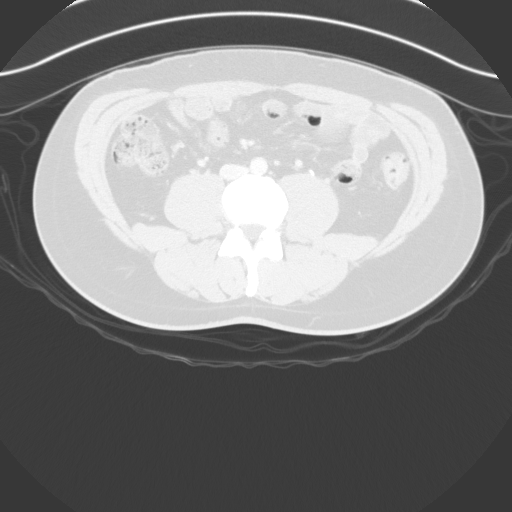
[im 64/102  soft-tissue]
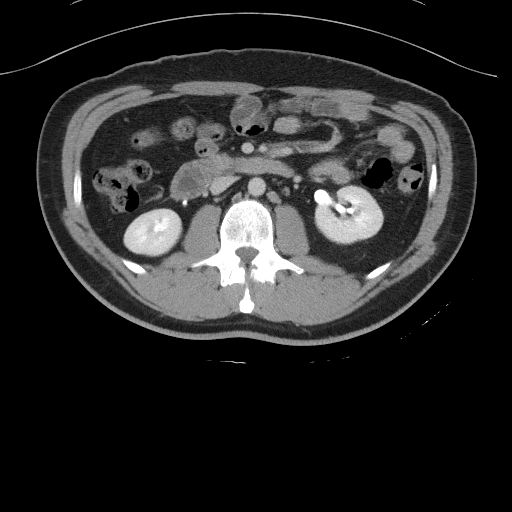
[im 64/102  lung]
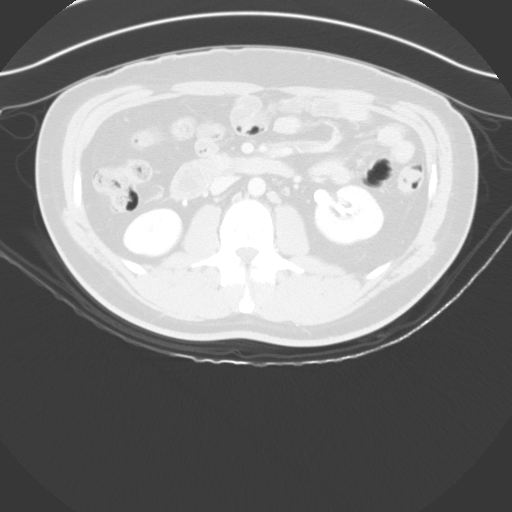
[im 76/102  soft-tissue]
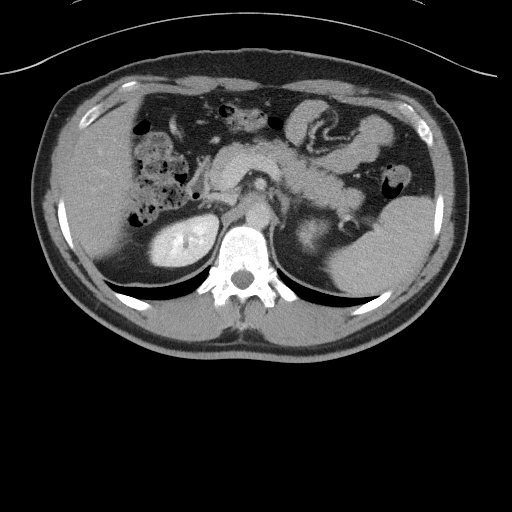
[im 76/102  lung]
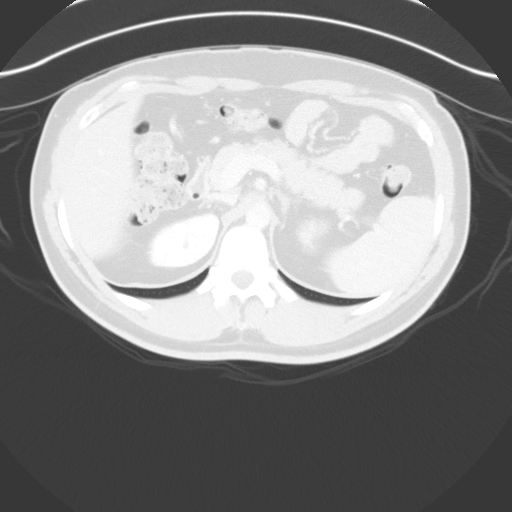
[im 89/102  soft-tissue]
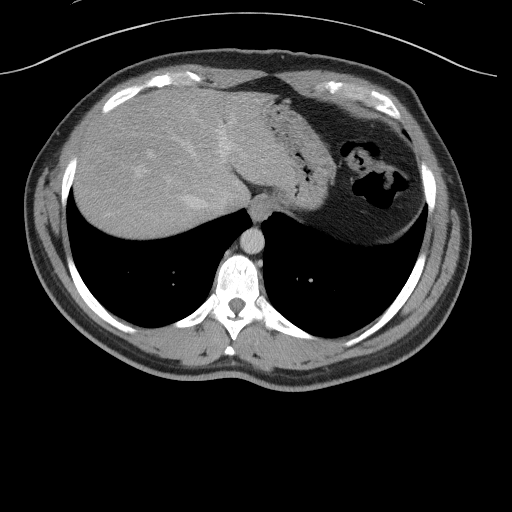
[im 89/102  lung]
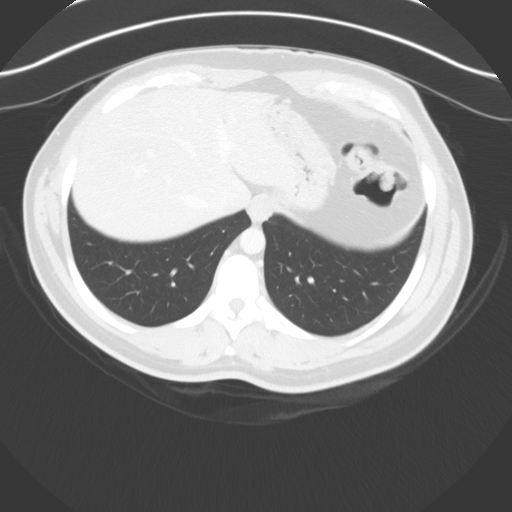

[13 of 46 positions shown; findings below may reference images not displayed]

FINDINGS: Lower chest: No acute abnormality.

Hepatobiliary: No solid liver abnormality is seen. No gallstones,
gallbladder wall thickening, or biliary dilatation.

Pancreas: Unremarkable. No pancreatic ductal dilatation or
surrounding inflammatory changes.

Spleen: Normal in size without significant abnormality.

Adrenals/Urinary Tract: Adrenal glands are unremarkable. There is a
2 mm calculus in the most distal right ureter (series 2, image 79).
No hydronephrosis. No suspicious mass or contrast enhancement. No
other urinary tract filling defect on delayed phase imaging. Bladder
is unremarkable.

Stomach/Bowel: Stomach is within normal limits. Small appendicolith
in the midportion of the appendix. Otherwise normal appendix. No
evidence of bowel wall thickening, distention, or inflammatory
changes.

Vascular/Lymphatic: No significant vascular findings are present. No
enlarged abdominal or pelvic lymph nodes.

Reproductive: No mass or other significant abnormality.

Other: No abdominal wall hernia or abnormality. No abdominopelvic
ascites.

Musculoskeletal: No acute or significant osseous findings.
IMPRESSION: 1. There is a 2 mm calculus in the most distal right ureter. No
hydronephrosis. No additional urinary tract calculi identified.

2. No suspicious mass or contrast enhancement. No other urinary
tract filling defect on delayed phase imaging.

## 2022-07-11 DIAGNOSIS — Z302 Encounter for sterilization: Secondary | ICD-10-CM | POA: Diagnosis not present

## 2023-01-30 DIAGNOSIS — Z1329 Encounter for screening for other suspected endocrine disorder: Secondary | ICD-10-CM | POA: Diagnosis not present

## 2023-01-30 DIAGNOSIS — Z13220 Encounter for screening for lipoid disorders: Secondary | ICD-10-CM | POA: Diagnosis not present

## 2023-01-30 DIAGNOSIS — Z0000 Encounter for general adult medical examination without abnormal findings: Secondary | ICD-10-CM | POA: Diagnosis not present

## 2023-01-30 DIAGNOSIS — Z125 Encounter for screening for malignant neoplasm of prostate: Secondary | ICD-10-CM | POA: Diagnosis not present

## 2023-03-18 DIAGNOSIS — R609 Edema, unspecified: Secondary | ICD-10-CM | POA: Diagnosis not present

## 2023-03-18 DIAGNOSIS — E782 Mixed hyperlipidemia: Secondary | ICD-10-CM | POA: Diagnosis not present

## 2023-03-18 DIAGNOSIS — R030 Elevated blood-pressure reading, without diagnosis of hypertension: Secondary | ICD-10-CM | POA: Diagnosis not present

## 2023-03-18 DIAGNOSIS — R252 Cramp and spasm: Secondary | ICD-10-CM | POA: Diagnosis not present

## 2023-04-10 DIAGNOSIS — D122 Benign neoplasm of ascending colon: Secondary | ICD-10-CM | POA: Diagnosis not present

## 2023-04-10 DIAGNOSIS — Z1211 Encounter for screening for malignant neoplasm of colon: Secondary | ICD-10-CM | POA: Diagnosis not present

## 2023-05-12 DIAGNOSIS — Z113 Encounter for screening for infections with a predominantly sexual mode of transmission: Secondary | ICD-10-CM | POA: Diagnosis not present

## 2023-05-20 DIAGNOSIS — I10 Essential (primary) hypertension: Secondary | ICD-10-CM | POA: Diagnosis not present
